# Patient Record
Sex: Male | Born: 2000 | Race: White | Hispanic: No | Marital: Single | Smoking: Never smoker
Health system: Southern US, Community
[De-identification: ages and names within clinical notes are randomized; demographics above are authoritative.]

## PROBLEM LIST (undated history)

## (undated) DIAGNOSIS — R251 Tremor, unspecified: Secondary | ICD-10-CM

## (undated) DIAGNOSIS — G43D0 Abdominal migraine, not intractable: Secondary | ICD-10-CM

## (undated) HISTORY — PX: HERNIA REPAIR: SHX51

---
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
## 2022-01-30 DIAGNOSIS — Z419 Encounter for procedure for purposes other than remedying health state, unspecified: Secondary | ICD-10-CM | POA: Diagnosis not present

## 2022-03-02 DIAGNOSIS — Z419 Encounter for procedure for purposes other than remedying health state, unspecified: Secondary | ICD-10-CM | POA: Diagnosis not present

## 2022-05-06 ENCOUNTER — Encounter (HOSPITAL_COMMUNITY): Payer: Self-pay | Admitting: *Deleted

## 2022-05-06 ENCOUNTER — Ambulatory Visit (HOSPITAL_COMMUNITY)
Admission: EM | Admit: 2022-05-06 | Discharge: 2022-05-06 | Disposition: A | Discharge status: Home / Self Care | Payer: Medicaid Other | Attending: Physician Assistant | Admitting: Physician Assistant

## 2022-05-06 DIAGNOSIS — S61432A Puncture wound without foreign body of left hand, initial encounter: Secondary | ICD-10-CM

## 2022-05-06 DIAGNOSIS — Z23 Encounter for immunization: Secondary | ICD-10-CM

## 2022-05-06 DIAGNOSIS — S6992XA Unspecified injury of left wrist, hand and finger(s), initial encounter: Secondary | ICD-10-CM

## 2022-05-06 MED ORDER — TETANUS-DIPHTH-ACELL PERTUSSIS 5-2.5-18.5 LF-MCG/0.5 IM SUSY
0.5000 mL | PREFILLED_SYRINGE | Freq: Once | INTRAMUSCULAR | Status: AC
  Administered 2022-05-06: 0.5 mL via INTRAMUSCULAR

## 2022-05-06 MED ORDER — TETANUS-DIPHTH-ACELL PERTUSSIS 5-2.5-18.5 LF-MCG/0.5 IM SUSY
PREFILLED_SYRINGE | INTRAMUSCULAR | Status: AC
  Filled 2022-05-06: qty 0.5

## 2022-05-06 NOTE — Discharge Instructions (Signed)
Advised take ibuprofen or Motrin for pain relief. Advised to soak the hand in Epsom salt soaks, 10 minutes, 3-4 times a day over the next couple days to decrease pain, swelling and discomfort.  Advised to watch for signs of infection such as redness, increased swelling, drainage.  If this occurs then you need to be reevaluated.  Advised to follow-up PCP return to urgent care as needed.

## 2022-05-06 NOTE — ED Provider Notes (Signed)
[facility]    CSN: [id number] Arrival date & time: 05/06/22  1318      History   Chief Complaint Chief Complaint  Patient presents with   Hand Injury    [name] is a 22 y.o. male.   22 year old male presents with left hand pain.  Patient indicates this afternoon 2 hours ago he was working tearing down an old fence when his left hand struck a screw holding the fence on.  Patient indicates that it tore some of the skin on the left anterior hand.  He indicates there was bleeding that the time.  Patient indicates since he has been having some swelling along the anterior thenar area of the left hand, bleeding is stopped.  Patient indicates that it has been a long time since he had his last tetanus.   Hand Injury   Past Medical History:  Diagnosis Date   Abdominal migraine    Occasional tremors     There are no problems to display for this patient.   Past Surgical History:  Procedure Laterality Date   HERNIA REPAIR         Home Medications    Prior to Admission medications   Not on File    Family History History reviewed. No pertinent family history.  Social History Social History   Tobacco Use   Smoking status: Never   Smokeless tobacco: Never  Vaping Use   Vaping Use: Every day  Substance Use Topics   Alcohol use: Yes    Comment: liquor "every few weeks"   Drug use: Yes    Types: Marijuana     Allergies   Patient has no known allergies.   Review of Systems Review of Systems  Skin:  Positive for wound (left hand puncture wound).     Physical Exam Triage Vital Signs ED Triage Vitals  Enc Vitals Group     BP 05/06/22 1350 138/88     Pulse Rate 05/06/22 1350 82     Resp 05/06/22 1350 16     Temp 05/06/22 1350 97.7 F (36.5 C)     Temp Source 05/06/22 1350 Oral     SpO2 05/06/22 1350 97 %     Weight --      Height --      Head Circumference --      Peak Flow --      Pain Score 05/06/22 1353 6     Pain Loc --       Pain Edu? --      Excl. in GC? --    No data found.  Updated Vital Signs BP 138/88   Pulse 82   Temp 97.7 F (36.5 C) (Oral)   Resp 16   SpO2 97%   Visual Acuity Right Eye Distance:   Left Eye Distance:   Bilateral Distance:    Right Eye Near:   Left Eye Near:    Bilateral Near:     Physical Exam Constitutional:      Appearance: Normal appearance.  Skin:    Comments: Left hand: There is a small 0.25 cm superficial puncture wound on the mid anterior thenar area, swelling less than 1+, mild pain on palpation, full range of motion normal, flexion extension is normal.  Patient indicates he can make a fist but he does have some mild pain at the site of injury when trying to close the fist.  There is no drainage from the area.  Neurological:  Mental Status: He is alert.      UC Treatments / Results  Labs (all labs ordered are listed, but only abnormal results are displayed) Labs Reviewed - No data to display  EKG   Radiology No results found.  Procedures Procedures (including critical care time)  Medications Ordered in UC Medications  Tdap (BOOSTRIX) injection 0.5 mL (0.5 mLs Intramuscular Given 05/06/22 1407)    Initial Impression / Assessment and Plan / UC Course  I have reviewed the triage vital signs and the nursing notes.  Pertinent labs & imaging results that were available during my care of the patient were reviewed by me and considered in my medical decision making (see chart for details).    Plan: The diagnosis will be treated with the following: 1.  Injury of left hand: A.  Tdap given in the office today. 2.  Puncture wound left hand without foreign body: A.  Advised to do Epsom salt soaks, 10 minutes, 3-4 times a day over the next couple days to reduce pain and swelling. B.  Advised take ibuprofen or Motrin for pain. C.  Patient advised to watch for signs of infection such as redness, swelling, drainage, if this occurs he needs to be  reevaluated. 3.  Advised follow-up PCP return to urgent care as needed.  Final Clinical Impressions(s) / UC Diagnoses   Final diagnoses:  Injury of left hand, initial encounter  Puncture wound of left hand without foreign body, initial encounter     Discharge Instructions      Advised take ibuprofen or Motrin for pain relief. Advised to soak the hand in Epsom salt soaks, 10 minutes, 3-4 times a day over the next couple days to decrease pain, swelling and discomfort.  Advised to watch for signs of infection such as redness, increased swelling, drainage.  If this occurs then you need to be reevaluated.  Advised to follow-up PCP return to urgent care as needed.    ED Prescriptions   None    PDMP not reviewed this encounter.   Ellsworth LennoxJames, Givanni Staron, PA-C 05/06/22 1408

## 2022-05-06 NOTE — ED Triage Notes (Signed)
Pt reports removing an old wooden fence that had screws in it approx 2 hrs ago; when he was pulling on it, a screw punctured into left anterior palm. States initially "blood was squirting from it"; no active bleeding at this time. C/O pain, swelling to area. C/O small area of numbness to webbing between left thumb and index finger, otherwise CMS intact.

## 2022-06-05 ENCOUNTER — Telehealth: Payer: Self-pay

## 2022-06-05 NOTE — Telephone Encounter (Signed)
Sending mychart msg. AS, CMA 

## 2023-09-22 ENCOUNTER — Emergency Department
Admission: EM | Admit: 2023-09-22 | Discharge: 2023-09-22 | Disposition: A | Discharge status: Home / Self Care | Attending: Emergency Medicine | Admitting: Emergency Medicine

## 2023-09-22 ENCOUNTER — Other Ambulatory Visit: Payer: Self-pay

## 2023-09-22 ENCOUNTER — Emergency Department

## 2023-09-22 DIAGNOSIS — S2232XA Fracture of one rib, left side, initial encounter for closed fracture: Secondary | ICD-10-CM | POA: Diagnosis not present

## 2023-09-22 DIAGNOSIS — R0789 Other chest pain: Secondary | ICD-10-CM | POA: Diagnosis present

## 2023-09-22 DIAGNOSIS — S30811A Abrasion of abdominal wall, initial encounter: Secondary | ICD-10-CM | POA: Diagnosis not present

## 2023-09-22 MED ORDER — TRAMADOL HCL 50 MG PO TABS: 50.0000 mg | ORAL_TABLET | Freq: Four times a day (QID) | ORAL | 0 refills | Status: AC | PRN

## 2023-09-22 MED ORDER — ACETAMINOPHEN 325 MG PO TABS
650.0000 mg | ORAL_TABLET | Freq: Once | ORAL | Status: AC
  Administered 2023-09-22: 650 mg via ORAL
  Filled 2023-09-22: qty 2

## 2023-09-22 MED ORDER — TRAMADOL HCL 50 MG PO TABS
50.0000 mg | ORAL_TABLET | Freq: Once | ORAL | Status: AC
  Administered 2023-09-22: 50 mg via ORAL
  Filled 2023-09-22: qty 1

## 2023-09-22 MED ORDER — ONDANSETRON 4 MG PO TBDP
4.0000 mg | ORAL_TABLET | Freq: Once | ORAL | Status: AC
  Administered 2023-09-22: 4 mg via ORAL
  Filled 2023-09-22: qty 1

## 2023-09-22 NOTE — ED Notes (Signed)
 Mother at first nurse desk askin
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
## 2023-09-22 NOTE — Discharge Instructions (Signed)
 Please use your incentive spirometer several times per hour while awake for the next 7 days.  Return to the emergency department for any worsening pain, any trouble breathing, or any other symptom personally concerning to yourself.  Please take Tylenol  or ibuprofen  as needed for chest wall discomfort as written on the box.  You may take your prescribed tramadol  if needed for more significant pain do not drink alcohol or drive while taking this medication.  Please follow-up with your doctor within the next several days for recheck/reevaluation.
